# Patient Record
Sex: Male | Born: 1970 | Race: Black or African American | Hispanic: No | Marital: Married | Smoking: Never smoker
Health system: Southern US, Community
[De-identification: ages and names within clinical notes are randomized; demographics above are authoritative.]

## PROBLEM LIST (undated history)

## (undated) ENCOUNTER — Emergency Department: Discharge status: Absent without leave | Payer: No Typology Code available for payment source

## (undated) DIAGNOSIS — K219 Gastro-esophageal reflux disease without esophagitis: Secondary | ICD-10-CM

## (undated) DIAGNOSIS — M795 Residual foreign body in soft tissue: Secondary | ICD-10-CM

---

## 1989-06-20 HISTORY — PX: EYE SURGERY: SHX253

## 2004-08-28 ENCOUNTER — Emergency Department (HOSPITAL_COMMUNITY): Admission: EM | Admit: 2004-08-28 | Discharge: 2004-08-28 | Payer: Self-pay | Admitting: Emergency Medicine

## 2005-01-11 ENCOUNTER — Emergency Department (HOSPITAL_COMMUNITY): Admission: EM | Admit: 2005-01-11 | Discharge: 2005-01-11 | Payer: Self-pay | Admitting: Emergency Medicine

## 2005-01-14 ENCOUNTER — Emergency Department (HOSPITAL_COMMUNITY): Admission: EM | Admit: 2005-01-14 | Discharge: 2005-01-14 | Payer: Self-pay | Admitting: Emergency Medicine

## 2009-09-07 ENCOUNTER — Emergency Department (HOSPITAL_COMMUNITY): Admission: EM | Admit: 2009-09-07 | Discharge: 2009-09-07 | Payer: Self-pay | Admitting: Emergency Medicine

## 2009-09-07 ENCOUNTER — Emergency Department (HOSPITAL_BASED_OUTPATIENT_CLINIC_OR_DEPARTMENT_OTHER): Admission: EM | Admit: 2009-09-07 | Discharge: 2009-09-07 | Payer: Self-pay | Admitting: Emergency Medicine

## 2009-12-28 ENCOUNTER — Emergency Department (HOSPITAL_BASED_OUTPATIENT_CLINIC_OR_DEPARTMENT_OTHER): Admission: EM | Admit: 2009-12-28 | Discharge: 2009-12-28 | Payer: Self-pay | Admitting: Emergency Medicine

## 2011-03-01 ENCOUNTER — Emergency Department (HOSPITAL_COMMUNITY): Payer: No Typology Code available for payment source

## 2011-03-01 ENCOUNTER — Emergency Department (HOSPITAL_COMMUNITY)
Admission: EM | Admit: 2011-03-01 | Discharge: 2011-03-01 | Disposition: A | Discharge status: Home / Self Care | Payer: No Typology Code available for payment source | Attending: Emergency Medicine | Admitting: Emergency Medicine

## 2011-03-01 DIAGNOSIS — M545 Low back pain, unspecified: Secondary | ICD-10-CM | POA: Insufficient documentation

## 2011-03-01 DIAGNOSIS — M542 Cervicalgia: Secondary | ICD-10-CM | POA: Insufficient documentation
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
## 2015-10-26 DIAGNOSIS — Z0000 Encounter for general adult medical examination without abnormal findings: Secondary | ICD-10-CM | POA: Insufficient documentation

## 2016-08-28 ENCOUNTER — Encounter (HOSPITAL_BASED_OUTPATIENT_CLINIC_OR_DEPARTMENT_OTHER): Payer: Self-pay

## 2016-08-28 ENCOUNTER — Emergency Department (HOSPITAL_BASED_OUTPATIENT_CLINIC_OR_DEPARTMENT_OTHER)
Admission: EM | Admit: 2016-08-28 | Discharge: 2016-08-28 | Disposition: A | Discharge status: Home / Self Care | Payer: No Typology Code available for payment source | Attending: Emergency Medicine | Admitting: Emergency Medicine

## 2016-08-28 DIAGNOSIS — Y999 Unspecified external cause status: Secondary | ICD-10-CM | POA: Insufficient documentation

## 2016-08-28 DIAGNOSIS — M542 Cervicalgia: Secondary | ICD-10-CM | POA: Insufficient documentation

## 2016-08-28 DIAGNOSIS — Y92410 Unspecified street and highway as the place of occurrence of the external cause: Secondary | ICD-10-CM | POA: Insufficient documentation

## 2016-08-28 DIAGNOSIS — Y9389 Activity, other specified: Secondary | ICD-10-CM | POA: Diagnosis not present

## 2016-08-28 DIAGNOSIS — M545 Low back pain, unspecified: Secondary | ICD-10-CM

## 2016-08-28 DIAGNOSIS — M62830 Muscle spasm of back: Secondary | ICD-10-CM | POA: Diagnosis not present

## 2016-08-28 DIAGNOSIS — S199XXA Unspecified injury of neck, initial encounter: Secondary | ICD-10-CM | POA: Diagnosis present

## 2016-08-28 MED ORDER — NAPROXEN 500 MG PO TABS: 500.0000 mg | ORAL_TABLET | Freq: Two times a day (BID) | ORAL | 0 refills | Status: DC | PRN

## 2016-08-28 MED ORDER — CYCLOBENZAPRINE HCL 10 MG PO TABS: 10.0000 mg | ORAL_TABLET | Freq: Three times a day (TID) | ORAL | 0 refills | Status: DC | PRN

## 2016-08-28 MED ORDER — NAPROXEN 250 MG PO TABS
500.0000 mg | ORAL_TABLET | Freq: Once | ORAL | Status: AC
  Administered 2016-08-28: 500 mg via ORAL
  Filled 2016-08-28: qty 2
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
## 2016-08-28 NOTE — Discharge Instructions (Signed)
Take naprosyn as directed for inflammation and pain with tylenol for breakthrough pain and flexeril for muscle relaxation. Do not drive or operate machinery with muscle relaxant use. Use heat to areas of soreness, no more than 20 minutes at a time every hour for each. Expect to be sore for the next few days and follow up with primary care physician for recheck of ongoing symptoms in the next 1-2 weeks. Return to ER for emergent changing or worsening of symptoms.
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
## 2019-06-27 DIAGNOSIS — S069X9A Unspecified intracranial injury with loss of consciousness of unspecified duration, initial encounter: Secondary | ICD-10-CM | POA: Diagnosis not present

## 2019-06-27 DIAGNOSIS — H579 Unspecified disorder of eye and adnexa: Secondary | ICD-10-CM | POA: Diagnosis not present

## 2019-06-27 DIAGNOSIS — Z7689 Persons encountering health services in other specified circumstances: Secondary | ICD-10-CM | POA: Diagnosis not present

## 2019-08-05 DIAGNOSIS — Z7689 Persons encountering health services in other specified circumstances: Secondary | ICD-10-CM | POA: Diagnosis not present

## 2019-10-20 ENCOUNTER — Ambulatory Visit
Admission: EM | Admit: 2019-10-20 | Discharge: 2019-10-20 | Disposition: A | Discharge status: Home / Self Care | Payer: Medicaid Other | Attending: Physician Assistant | Admitting: Physician Assistant

## 2019-10-20 ENCOUNTER — Other Ambulatory Visit: Payer: Self-pay

## 2019-10-20 DIAGNOSIS — M25511 Pain in right shoulder: Secondary | ICD-10-CM

## 2019-10-20 DIAGNOSIS — M546 Pain in thoracic spine: Secondary | ICD-10-CM

## 2019-10-20 MED ORDER — DICLOFENAC SODIUM 75 MG PO TBEC: 75.0000 mg | DELAYED_RELEASE_TABLET | Freq: Two times a day (BID) | ORAL | 0 refills | Status: DC

## 2019-10-20 MED ORDER — TIZANIDINE HCL 2 MG PO TABS: 2.0000 mg | ORAL_TABLET | Freq: Three times a day (TID) | ORAL | 0 refills | Status: DC | PRN
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
## 2019-11-19 DIAGNOSIS — H52222 Regular astigmatism, left eye: Secondary | ICD-10-CM | POA: Diagnosis not present

## 2019-11-27 DIAGNOSIS — H5213 Myopia, bilateral: Secondary | ICD-10-CM | POA: Diagnosis not present

## 2020-01-16 NOTE — Patient Instructions (Signed)
Thank you for choosing Primary Care at [facility] to be your medical home!    Louis Ivery was seen by Melina Schools, DO today.   Thelma Barge primary care provider is Phill Myron, DO.   For the best care possible, you should try to see Phill Myron, DO whenever you come to the clinic.   We look forward to seeing you again soon!  If you have any questions about your visit today, please call us at [phone] or feel free to reach your primary care provider via [facility].

## 2020-01-17 ENCOUNTER — Telehealth (INDEPENDENT_AMBULATORY_CARE_PROVIDER_SITE_OTHER): Payer: Medicaid Other | Admitting: Internal Medicine

## 2020-01-17 ENCOUNTER — Encounter: Payer: Self-pay | Admitting: Internal Medicine

## 2020-01-17 DIAGNOSIS — M25562 Pain in left knee: Secondary | ICD-10-CM | POA: Diagnosis not present

## 2020-01-17 DIAGNOSIS — G8929 Other chronic pain: Secondary | ICD-10-CM | POA: Diagnosis not present

## 2020-01-17 DIAGNOSIS — Z7689 Persons encountering health services in other specified circumstances: Secondary | ICD-10-CM

## 2020-01-17 DIAGNOSIS — M545 Low back pain, unspecified: Secondary | ICD-10-CM

## 2020-01-17 DIAGNOSIS — M25561 Pain in right knee: Secondary | ICD-10-CM | POA: Diagnosis not present
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
## 2020-01-31 ENCOUNTER — Ambulatory Visit (INDEPENDENT_AMBULATORY_CARE_PROVIDER_SITE_OTHER): Payer: Medicaid Other | Admitting: Family Medicine

## 2020-01-31 ENCOUNTER — Encounter: Payer: Self-pay | Admitting: Family Medicine

## 2020-01-31 ENCOUNTER — Ambulatory Visit (INDEPENDENT_AMBULATORY_CARE_PROVIDER_SITE_OTHER): Payer: Medicaid Other

## 2020-01-31 ENCOUNTER — Ambulatory Visit: Payer: Self-pay

## 2020-01-31 DIAGNOSIS — M25562 Pain in left knee: Secondary | ICD-10-CM

## 2020-01-31 DIAGNOSIS — G8929 Other chronic pain: Secondary | ICD-10-CM

## 2020-01-31 DIAGNOSIS — M5441 Lumbago with sciatica, right side: Secondary | ICD-10-CM | POA: Diagnosis not present

## 2020-01-31 DIAGNOSIS — M25561 Pain in right knee: Secondary | ICD-10-CM | POA: Diagnosis not present

## 2020-01-31 DIAGNOSIS — M5442 Lumbago with sciatica, left side: Secondary | ICD-10-CM | POA: Diagnosis not present

## 2020-01-31 MED ORDER — MELOXICAM 15 MG PO TABS: 7.5000 mg | ORAL_TABLET | Freq: Every day | ORAL | 6 refills | Status: DC | PRN

## 2020-01-31 NOTE — Patient Instructions (Signed)
   Glucosamine Sulfate:  1,000 mg twice daily  Turmeric:  500 mg twice daily   
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
## 2020-02-11 ENCOUNTER — Ambulatory Visit: Payer: Medicaid Other | Admitting: Internal Medicine

## 2020-02-29 DIAGNOSIS — H524 Presbyopia: Secondary | ICD-10-CM | POA: Diagnosis not present

## 2020-03-12 ENCOUNTER — Encounter: Payer: Self-pay | Admitting: Family Medicine

## 2020-03-13 MED ORDER — NABUMETONE 500 MG PO TABS: 500.0000 mg | ORAL_TABLET | Freq: Two times a day (BID) | ORAL | 3 refills | Status: DC | PRN

## 2020-03-18 ENCOUNTER — Other Ambulatory Visit: Payer: Self-pay

## 2020-03-18 ENCOUNTER — Telehealth: Payer: Self-pay

## 2020-03-18 MED ORDER — NABUMETONE 500 MG PO TABS: 500.0000 mg | ORAL_TABLET | Freq: Two times a day (BID) | ORAL | 3 refills | Status: DC | PRN

## 2020-03-18 NOTE — Telephone Encounter (Signed)
error 

## 2020-03-18 NOTE — Telephone Encounter (Signed)
Advised the patient's wife the medication was resent to [facility].

## 2020-03-18 NOTE — Telephone Encounter (Signed)
Patient wife called she is requesting prescription to be resent to a different pharmacy for Nabumetone.   Pharmacy: [facility] on [address] [place]  [address]  Call back:[phone]

## 2020-03-19 MED ORDER — NABUMETONE 500 MG PO TABS: 500.0000 mg | ORAL_TABLET | Freq: Two times a day (BID) | ORAL | 3 refills | Status: DC | PRN

## 2020-03-19 NOTE — Addendum Note (Signed)
Addended by: Hortencia Pilar on: 03/19/2020 08:07 AM   Modules accepted: Orders

## 2020-03-31 ENCOUNTER — Other Ambulatory Visit: Payer: Self-pay | Admitting: Family Medicine

## 2020-03-31 MED ORDER — CELECOXIB 200 MG PO CAPS: 200.0000 mg | ORAL_CAPSULE | Freq: Two times a day (BID) | ORAL | 6 refills | Status: DC | PRN

## 2020-04-30 NOTE — Patient Instructions (Signed)

## 2020-05-01 ENCOUNTER — Encounter: Payer: Self-pay | Admitting: Internal Medicine

## 2020-05-01 ENCOUNTER — Ambulatory Visit (INDEPENDENT_AMBULATORY_CARE_PROVIDER_SITE_OTHER): Payer: Medicaid Other | Admitting: Internal Medicine

## 2020-05-01 ENCOUNTER — Other Ambulatory Visit: Payer: Self-pay

## 2020-05-01 VITALS — BP 141/90 | HR 94 | Temp 98.1°F | Resp 17 | Ht 72.0 in | Wt 271.0 lb

## 2020-05-01 DIAGNOSIS — Z130 Encounter for screening for diseases of the blood and blood-forming organs and certain disorders involving the immune mechanism: Secondary | ICD-10-CM

## 2020-05-01 DIAGNOSIS — Z113 Encounter for screening for infections with a predominantly sexual mode of transmission: Secondary | ICD-10-CM

## 2020-05-01 DIAGNOSIS — Z1159 Encounter for screening for other viral diseases: Secondary | ICD-10-CM

## 2020-05-01 DIAGNOSIS — Z125 Encounter for screening for malignant neoplasm of prostate: Secondary | ICD-10-CM | POA: Diagnosis not present

## 2020-05-01 DIAGNOSIS — Z1211 Encounter for screening for malignant neoplasm of colon: Secondary | ICD-10-CM

## 2020-05-01 DIAGNOSIS — Z0000 Encounter for general adult medical examination without abnormal findings: Secondary | ICD-10-CM

## 2020-05-01 DIAGNOSIS — Z13220 Encounter for screening for lipoid disorders: Secondary | ICD-10-CM

## 2020-05-01 DIAGNOSIS — Z13228 Encounter for screening for other metabolic disorders: Secondary | ICD-10-CM

## 2020-05-01 DIAGNOSIS — N529 Male erectile dysfunction, unspecified: Secondary | ICD-10-CM

## 2020-05-01 MED ORDER — SILDENAFIL CITRATE 100 MG PO TABS: 50.0000 mg | ORAL_TABLET | Freq: Every day | ORAL | 11 refills | Status: DC | PRN

## 2020-05-01 NOTE — Progress Notes (Signed)
Subjective:    Tyler Alexander - 49 y.o. male MRN [id number]  Date of birth: 26-Jun-1970  HPI  Tyler Alexander is here for annual exam. Not currently employed. Works out by walking daily and [employer].      Health Maintenance:  Health Maintenance Due  Topic Date Due  . Hepatitis C Screening  Never done  . COVID-19 Vaccine (1) Never done  . HIV Screening  Never done  . COLONOSCOPY  Never done    -  reports that he has never smoked. He has never used smokeless tobacco. - Review of Systems: Per HPI. - Past Medical History: There are no problems to display for this patient.  - Medications: reviewed and updated   Objective:   Physical Exam BP (!) 141/90   Pulse 94   Temp 98.1 F (36.7 C) (Temporal)   Resp 17   Ht 6' (1.829 m)   Wt 271 lb (122.9 kg)   SpO2 96%   BMI 36.75 kg/m  Physical Exam Constitutional:      Appearance: He is not diaphoretic.  HENT:     Head: Normocephalic and atraumatic.  Eyes:     Conjunctiva/sclera: Conjunctivae normal.     Pupils: Pupils are equal, round, and reactive to light.  Neck:     Thyroid: No thyromegaly.  Cardiovascular:     Rate and Rhythm: Normal rate and regular rhythm.     Heart sounds: Normal heart sounds. No murmur heard.   Pulmonary:     Effort: Pulmonary effort is normal. No respiratory distress.     Breath sounds: Normal breath sounds. No wheezing.  Abdominal:     General: Bowel sounds are normal. There is no distension.     Palpations: Abdomen is soft.     Tenderness: There is no abdominal tenderness. There is no guarding or rebound.  Musculoskeletal:        General: No deformity. Normal range of motion.     Cervical back: Normal range of motion and neck supple.  Lymphadenopathy:     Cervical: No cervical adenopathy.  Skin:    General: Skin is warm and dry.     Findings: No rash.  Neurological:     Mental Status: He is alert and oriented to person, place, and time.     Gait: Gait is intact.  Psychiatric:         Mood and Affect: Mood and affect normal.        Judgment: Judgment normal.            Assessment & Plan:   1. Annual physical exam Counseled on 150 minutes of exercise per week, healthy eating (including decreased daily intake of saturated fats, cholesterol, added sugars, sodium), STI prevention, routine healthcare maintenance. - CBC with Differential - Comprehensive metabolic panel - Lipid Panel  2. Screening for deficiency anemia - CBC with Differential  3. Screening for metabolic disorder - Comprehensive metabolic panel  4. Lipid screening - Lipid Panel  5. Need for hepatitis C screening test - HCV Ab w/Rflx to Verification  6. Screening for STDs (sexually transmitted diseases) - HIV antibody (with reflex) - RPR - GC/Chlamydia Probe Amp  7. Colon cancer screening Discussed different modalities of colon cancer screening. Patient opted for Cologuard.   8. Screening PSA (prostate specific antigen) Discussed risk vs. benefit of screening for prostate cancer. Patient opted out.   9. Erectile dysfunction, unspecified erectile dysfunction type - sildenafil (VIAGRA) 100 MG tablet; Take 0.5-1 tablets (50-100 mg  total) by mouth daily as needed for erectile dysfunction.  Dispense: 5 tablet; Refill: [place], D.O. 05/01/2020, 9:07 AM Primary Care at [facility]

## 2020-05-02 LAB — CBC WITH DIFFERENTIAL/PLATELET
Basophils Absolute: 0 10*3/uL (ref 0.0–0.2)
Basos: 1 %
EOS (ABSOLUTE): 0.3 10*3/uL (ref 0.0–0.4)
Eos: 8 %
Hematocrit: 39.1 % (ref 37.5–51.0)
Hemoglobin: 13.3 g/dL (ref 13.0–17.7)
Immature Grans (Abs): 0 10*3/uL (ref 0.0–0.1)
Immature Granulocytes: 0 %
Lymphocytes Absolute: 1.3 10*3/uL (ref 0.7–3.1)
Lymphs: 35 %
MCH: 31.4 pg (ref 26.6–33.0)
MCHC: 34 g/dL (ref 31.5–35.7)
MCV: 92 fL (ref 79–97)
Monocytes Absolute: 0.5 10*3/uL (ref 0.1–0.9)
Monocytes: 13 %
Neutrophils Absolute: 1.6 10*3/uL (ref 1.4–7.0)
Neutrophils: 43 %
Platelets: 161 10*3/uL (ref 150–450)
RBC: 4.23 x10E6/uL (ref 4.14–5.80)
RDW: 12.8 % (ref 11.6–15.4)
WBC: 3.8 10*3/uL (ref 3.4–10.8)

## 2020-05-02 LAB — COMPREHENSIVE METABOLIC PANEL
ALT: 31 IU/L (ref 0–44)
AST: 31 IU/L (ref 0–40)
Albumin/Globulin Ratio: 1.7 (ref 1.2–2.2)
Albumin: 4.5 g/dL (ref 4.0–5.0)
Alkaline Phosphatase: 81 IU/L (ref 44–121)
BUN/Creatinine Ratio: 9 (ref 9–20)
BUN: 11 mg/dL (ref 6–24)
Bilirubin Total: 0.4 mg/dL (ref 0.0–1.2)
CO2: 25 mmol/L (ref 20–29)
Calcium: 9.2 mg/dL (ref 8.7–10.2)
Chloride: 105 mmol/L (ref 96–106)
Creatinine, Ser: 1.2 mg/dL (ref 0.76–1.27)
GFR calc Af Amer: 81 mL/min/{1.73_m2} (ref >59)
GFR calc non Af Amer: 70 mL/min/{1.73_m2} (ref >59)
Globulin, Total: 2.7 g/dL (ref 1.5–4.5)
Glucose: 96 mg/dL (ref 65–99)
Potassium: 4.3 mmol/L (ref 3.5–5.2)
Sodium: 141 mmol/L (ref 134–144)
Total Protein: 7.2 g/dL (ref 6.0–8.5)

## 2020-05-02 LAB — LIPID PANEL
Chol/HDL Ratio: 3.5 ratio (ref 0.0–5.0)
Cholesterol, Total: 210 mg/dL — ABNORMAL HIGH (ref 100–199)
HDL: 60 mg/dL (ref >39)
LDL Chol Calc (NIH): 137 mg/dL — ABNORMAL HIGH (ref 0–99)
Triglycerides: 74 mg/dL (ref 0–149)
VLDL Cholesterol Cal: 13 mg/dL (ref 5–40)

## 2020-05-02 LAB — RPR: RPR Ser Ql: NONREACTIVE

## 2020-05-02 LAB — HCV AB W/RFLX TO VERIFICATION: HCV Ab: <0.1 s/co ratio (ref 0.0–0.9)

## 2020-05-02 LAB — HCV INTERPRETATION

## 2020-05-02 LAB — HIV ANTIBODY (ROUTINE TESTING W REFLEX): HIV Screen 4th Generation wRfx: NONREACTIVE

## 2020-05-05 ENCOUNTER — Telehealth: Payer: Self-pay

## 2020-05-05 LAB — GC/CHLAMYDIA PROBE AMP
Chlamydia trachomatis, NAA: NEGATIVE
Neisseria Gonorrhoeae by PCR: NEGATIVE

## 2020-05-05 NOTE — Telephone Encounter (Signed)
Pt verified. Spoke w/pt aware of negative results.

## 2020-07-21 ENCOUNTER — Encounter: Payer: Self-pay | Admitting: Family Medicine

## 2020-07-21 DIAGNOSIS — M5441 Lumbago with sciatica, right side: Secondary | ICD-10-CM

## 2020-07-21 DIAGNOSIS — G8929 Other chronic pain: Secondary | ICD-10-CM

## 2020-10-29 ENCOUNTER — Ambulatory Visit: Payer: Medicaid Other | Admitting: Internal Medicine

## 2021-01-20 ENCOUNTER — Telehealth: Payer: Self-pay | Admitting: Internal Medicine

## 2021-01-20 NOTE — Telephone Encounter (Signed)
..   Medicaid Managed Care   Unsuccessful Outreach Note  01/20/2021 Name: Tyler Alexander MRN: [id number] DOB: 1971-04-17  Referred by: Nicolette Bang, MD Reason for referral : High Risk Managed Medicaid (I attempted to reach the patient today to offer him a phone visit appt with the [facility] Team. I called twice and kept getting a fast busy signal.)   An unsuccessful telephone outreach was attempted today. The patient was referred to the case management team for assistance with care management and care coordination.   Follow Up Plan: The care management team will reach out to the patient again over the next 7-14 days.   [place]

## 2021-05-04 ENCOUNTER — Telehealth: Payer: Self-pay | Admitting: Family

## 2021-05-04 DIAGNOSIS — R490 Dysphonia: Secondary | ICD-10-CM

## 2021-05-04 DIAGNOSIS — Z87891 Personal history of nicotine dependence: Secondary | ICD-10-CM

## 2021-05-04 DIAGNOSIS — K219 Gastro-esophageal reflux disease without esophagitis: Secondary | ICD-10-CM

## 2021-05-04 MED ORDER — CETIRIZINE HCL 10 MG PO TABS: 10.0000 mg | ORAL_TABLET | Freq: Every day | ORAL | 11 refills | Status: AC

## 2021-05-04 MED ORDER — OMEPRAZOLE 20 MG PO CPDR: 20.0000 mg | DELAYED_RELEASE_CAPSULE | Freq: Every day | ORAL | 3 refills | Status: DC

## 2021-05-04 NOTE — Progress Notes (Signed)
Virtual Visit Consent   Tyler Alexander, you are scheduled for a virtual visit with a [facility] provider today.     Just as with appointments in the office, your consent must be obtained to participate.  Your consent will be active for this visit and any virtual visit you may have with one of our providers in the next 365 days.     If you have a MyChart account, a copy of this consent can be sent to you electronically.  All virtual visits are billed to your insurance company just like a traditional visit in the office.    As this is a virtual visit, video technology does not allow for your provider to perform a traditional examination.  This may limit your provider's ability to fully assess your condition.  If your provider identifies any concerns that need to be evaluated in person or the need to arrange testing (such as labs, EKG, etc.), we will make arrangements to do so.     Although advances in technology are sophisticated, we cannot ensure that it will always work on either your end or our end.  If the connection with a video visit is poor, the visit may have to be switched to a telephone visit.  With either a video or telephone visit, we are not always able to ensure that we have a secure connection.     I need to obtain your verbal consent now.   Are you willing to proceed with your visit today?    Tyler Alexander has provided verbal consent on 05/04/2021 for a virtual visit (video or telephone).   Evelina Dun, FNP   Date: 05/04/2021 6:10 PM   Virtual Visit via Video Note   I, Evelina Dun, connected with  Tyler Alexander  ([id number], [birth year]) on 05/04/21 at  6:00 PM EST by a video-enabled telemedicine application and verified that I am speaking with the correct person using two identifiers.  Location: Patient: Virtual Visit Location Patient: Home Provider: Virtual Visit Location Provider: Home Office   I discussed the limitations of evaluation and management by telemedicine  and the availability of in person appointments. The patient expressed understanding and agreed to proceed.    History of Present Illness: Tyler Alexander is a 50 y.o. who identifies as a male who was assigned male at birth, and is being seen today for hoarse voice that started a month ago. He denies any fevers, congestion, sore throat, or cough. He does have a hx of smoking for 30 years with a 2 pack hx.   He does reports intermittent GERD symptoms twice over the last month that has woke him up with acid in his throat.    HPI: HPI  Problems: There are no problems to display for this patient.   Allergies: No Known Allergies Medications:  Current Outpatient Medications:    cetirizine (ZYRTEC) 10 MG tablet, Take 1 tablet (10 mg total) by mouth daily., Disp: 30 tablet, Rfl: 11   omeprazole (PRILOSEC) 20 MG capsule, Take 1 capsule (20 mg total) by mouth daily., Disp: 30 capsule, Rfl: 3   Multiple Vitamins-Minerals (ALIVE MULTI-VITAMIN PO), Take by mouth daily., Disp: , Rfl:    sildenafil (VIAGRA) 100 MG tablet, Take 0.5-1 tablets (50-100 mg total) by mouth daily as needed for erectile dysfunction., Disp: 5 tablet, Rfl: 11  Observations/Objective: Patient is well-developed, well-nourished in no acute distress.  Resting comfortably  at home.  Head is normocephalic, atraumatic.  No labored breathing.  Speech is clear  and coherent with logical content.  Patient is alert and oriented at baseline.  Hoarse voice   Assessment and Plan: 1. Hoarseness of voice - cetirizine (ZYRTEC) 10 MG tablet; Take 1 tablet (10 mg total) by mouth daily.  Dispense: 30 tablet; Refill: 11  2. Hx of smoking  3. Gastroesophageal reflux disease, unspecified whether esophagitis present - omeprazole (PRILOSEC) 20 MG capsule; Take 1 capsule (20 mg total) by mouth daily.  Dispense: 30 capsule; Refill: 3 Start zyrtec daily  Omeprazole 20 mg daily -Diet discussed- Avoid fried, spicy, citrus foods, caffeine and alcohol -Do  not eat 2-3 hours before bedtime -Encouraged small frequent meals -Avoid NSAID's He does need to call his PCP this week and schedule follow up. Discussed given 30 year smoking history this needs to be evaluated more to rule out cancer/tumor of head and neck.  Follow Up Instructions: I discussed the assessment and treatment plan with the patient. The patient was provided an opportunity to ask questions and all were answered. The patient agreed with the plan and demonstrated an understanding of the instructions.  A copy of instructions were sent to the patient via MyChart unless otherwise noted below.     The patient was advised to call back or seek an in-person evaluation if the symptoms worsen or if the condition fails to improve as anticipated.  Time:  I spent 22 minutes with the patient via telehealth technology discussing the above problems/concerns.    Evelina Dun, FNP

## 2021-05-18 ENCOUNTER — Ambulatory Visit: Admission: EM | Admit: 2021-05-18 | Discharge: 2021-05-18 | Discharge status: Against Medical Advice | Payer: Self-pay

## 2021-05-19 ENCOUNTER — Other Ambulatory Visit: Payer: Self-pay

## 2021-05-19 ENCOUNTER — Ambulatory Visit
Admission: EM | Admit: 2021-05-19 | Discharge: 2021-05-19 | Disposition: A | Discharge status: Home / Self Care | Payer: Self-pay | Attending: Physician Assistant | Admitting: Physician Assistant

## 2021-05-19 VITALS — BP 122/89 | HR 94 | Temp 98.8°F | Ht 74.0 in | Wt 250.0 lb

## 2021-05-19 DIAGNOSIS — J040 Acute laryngitis: Secondary | ICD-10-CM

## 2021-05-19 MED ORDER — PREDNISONE 20 MG PO TABS: 40.0000 mg | ORAL_TABLET | Freq: Every day | ORAL | 0 refills | Status: AC

## 2021-05-19 NOTE — ED Triage Notes (Signed)
Patient c/o that his voice has been hoarse for 2 months.  Patient did do a video visit and was prescribed medicine for acid reflux which did not help at all.

## 2021-05-19 NOTE — ED Provider Notes (Signed)
[facility]    CSN: [id number] Arrival date & time: 05/19/21  1700      History   Chief Complaint Chief Complaint  Patient presents with   Voice Hoarness    HPI Tyler Alexander is a 50 y.o. male.   Patient here today for evaluation of hoarseness he has had for the last 6-8 weeks. He denies any throat pain. He has tried PPI that was prescribed without improvement. He denies any shortness of breath. He has not had difficulty swallowing.   The history is provided by the patient.   History reviewed. No pertinent past medical history.  There are no problems to display for this patient.   Past Surgical History:  Procedure Laterality Date   EYE SURGERY Right 1991       Home Medications    Prior to Admission medications   Medication Sig Start Date End Date Taking? Authorizing Provider  cetirizine (ZYRTEC) 10 MG tablet Take 1 tablet (10 mg total) by mouth daily. 05/04/21  Yes Hawks, Christy A, FNP  Multiple Vitamins-Minerals (ALIVE MULTI-VITAMIN PO) Take by mouth daily.   Yes [provider]  omeprazole (PRILOSEC) 20 MG capsule Take 1 capsule (20 mg total) by mouth daily. 05/04/21  Yes Hawks, Christy A, FNP  predniSONE (DELTASONE) 20 MG tablet Take 2 tablets (40 mg total) by mouth daily with breakfast for 5 days. 05/19/21 05/24/21 Yes Francene Finders, PA-C  sildenafil (VIAGRA) 100 MG tablet Take 0.5-1 tablets (50-100 mg total) by mouth daily as needed for erectile dysfunction. 05/01/20  Yes Nicolette Bang, MD    Family History History reviewed. No pertinent family history.  Social History Social History   Tobacco Use   Smoking status: Never   Smokeless tobacco: Never  Substance Use Topics   Alcohol use: Yes   Drug use: No     Allergies   Patient has no known allergies.   Review of Systems Review of Systems  Constitutional:  Negative for chills and fever.  HENT:  Positive for voice change. Negative for congestion, ear pain,  sore throat and trouble swallowing.   Eyes:  Negative for discharge and redness.  Respiratory:  Negative for cough and shortness of breath.   Gastrointestinal:  Negative for abdominal pain, nausea and vomiting.    Physical Exam Triage Vital Signs ED Triage Vitals  Enc Vitals Group     BP      Pulse      Resp      Temp      Temp src      SpO2      Weight      Height      Head Circumference      Peak Flow      Pain Score      Pain Loc      Pain Edu?      Excl. in [place]?    No data found.  Updated Vital Signs BP 122/89 (BP Location: Right Arm)   Pulse 94   Temp 98.8 F (37.1 C) (Oral)   Ht 6\' 2"  (1.88 m)   Wt 250 lb (113.4 kg)   SpO2 97%   BMI 32.10 kg/m      Physical Exam Vitals and nursing note reviewed.  Constitutional:      General: He is not in acute distress.    Appearance: Normal appearance. He is not ill-appearing.  HENT:     Head: Normocephalic and atraumatic.     Nose:  Nose normal. No congestion.     Mouth/Throat:     Mouth: Mucous membranes are moist.     Pharynx: Oropharynx is clear. No oropharyngeal exudate or posterior oropharyngeal erythema.  Eyes:     Conjunctiva/sclera: Conjunctivae normal.  Cardiovascular:     Rate and Rhythm: Normal rate.  Pulmonary:     Effort: Pulmonary effort is normal. No respiratory distress.  Skin:    General: Skin is warm and dry.  Neurological:     Mental Status: He is alert.  Psychiatric:        Mood and Affect: Mood normal.        Thought Content: Thought content normal.     UC Treatments / Results  Labs (all labs ordered are listed, but only abnormal results are displayed) Labs Reviewed - No data to display  EKG   Radiology No results found.  Procedures Procedures (including critical care time)  Medications Ordered in UC Medications - No data to display  Initial Impression / Assessment and Plan / UC Course  I have reviewed the triage vital signs and the nursing notes.  Pertinent labs &  imaging results that were available during my care of the patient were reviewed by me and considered in my medical decision making (see chart for details).   Will trial steroid burst to hopefully decrease inflammation and improve symptoms but strict instruction given to follow up with PCP if symptoms are not improving with treatment. Patient expresses understanding.   Final Clinical Impressions(s) / UC Diagnoses   Final diagnoses:  Laryngitis     Discharge Instructions      Follow up with PCP if symptoms persist after steroid treatment.      ED Prescriptions     Medication Sig Dispense Auth. Provider   predniSONE (DELTASONE) 20 MG tablet Take 2 tablets (40 mg total) by mouth daily with breakfast for 5 days. 10 tablet Francene Finders, PA-C      PDMP not reviewed this encounter.   Francene Finders, PA-C 05/19/21 [phone]

## 2021-05-19 NOTE — Discharge Instructions (Signed)
Follow up with PCP if symptoms persist after steroid treatment.

## 2021-05-27 ENCOUNTER — Ambulatory Visit (INDEPENDENT_AMBULATORY_CARE_PROVIDER_SITE_OTHER): Payer: Self-pay | Admitting: Family Medicine

## 2021-05-27 ENCOUNTER — Other Ambulatory Visit: Payer: Self-pay

## 2021-05-27 ENCOUNTER — Encounter: Payer: Self-pay | Admitting: Family Medicine

## 2021-05-27 VITALS — BP 132/87 | HR 80 | Temp 98.1°F | Resp 16 | Ht 74.0 in | Wt 266.4 lb

## 2021-05-27 DIAGNOSIS — K2100 Gastro-esophageal reflux disease with esophagitis, without bleeding: Secondary | ICD-10-CM

## 2021-05-27 DIAGNOSIS — F32A Depression, unspecified: Secondary | ICD-10-CM

## 2021-05-27 DIAGNOSIS — R490 Dysphonia: Secondary | ICD-10-CM

## 2021-05-27 DIAGNOSIS — N529 Male erectile dysfunction, unspecified: Secondary | ICD-10-CM

## 2021-05-27 DIAGNOSIS — F419 Anxiety disorder, unspecified: Secondary | ICD-10-CM

## 2021-05-27 MED ORDER — PAROXETINE HCL 20 MG PO TABS: 20.0000 mg | ORAL_TABLET | Freq: Every day | ORAL | 0 refills | Status: AC

## 2021-05-27 MED ORDER — OMEPRAZOLE 40 MG PO CPDR: 40.0000 mg | DELAYED_RELEASE_CAPSULE | Freq: Every day | ORAL | 3 refills | Status: AC

## 2021-05-27 MED ORDER — SILDENAFIL CITRATE 100 MG PO TABS: 50.0000 mg | ORAL_TABLET | Freq: Every day | ORAL | 11 refills | Status: AC | PRN
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
## 2021-05-31 ENCOUNTER — Encounter: Payer: Self-pay | Admitting: Family Medicine

## 2021-05-31 NOTE — Progress Notes (Signed)
Established Patient Office Visit  Subjective:  Patient ID: Tyler Alexander, male    DOB: 1970/12/28  Age: 50 y.o. MRN: [id number]  CC: No chief complaint on file.   HPI Eligio Angert presents for complaint of increasing reflux and now with hoarseness. Patient is a smoker.  No past medical history on file.  Past Surgical History:  Procedure Laterality Date   EYE SURGERY Right 1991    No family history on file.  Social History   Socioeconomic History   Marital status: Married    Spouse name: Not on file   Number of children: Not on file   Years of education: Not on file   Highest education level: Not on file  Occupational History   Not on file  Tobacco Use   Smoking status: Never   Smokeless tobacco: Never  Substance and Sexual Activity   Alcohol use: Yes   Drug use: No   Sexual activity: Not on file  Other Topics Concern   Not on file  Social History Narrative   Not on file   Social Determinants of Health   Financial Resource Strain: Not on file  Food Insecurity: Not on file  Transportation Needs: Not on file  Physical Activity: Not on file  Stress: Not on file  Social Connections: Not on file  Intimate Partner Violence: Not on file    ROS Review of Systems  HENT:  Positive for sore throat and voice change. Negative for trouble swallowing.   All other systems reviewed and are negative.  Objective:   Today's Vitals: BP 132/87   Pulse 80   Temp 98.1 F (36.7 C) (Oral)   Resp 16   Ht 6\' 2"  (1.88 m)   Wt 266 lb 6.4 oz (120.8 kg)   SpO2 95%   BMI 34.20 kg/m   Physical Exam Vitals and nursing note reviewed.  Constitutional:      General: He is not in acute distress. HENT:     Mouth/Throat:     Mouth: Mucous membranes are moist.     Pharynx: Oropharynx is clear. No oropharyngeal exudate or posterior oropharyngeal erythema.  Cardiovascular:     Rate and Rhythm: Normal rate and regular rhythm.  Pulmonary:     Effort: Pulmonary effort is normal.      Breath sounds: Normal breath sounds.  Abdominal:     Palpations: Abdomen is soft.     Tenderness: There is no abdominal tenderness.  Neurological:     General: No focal deficit present.     Mental Status: He is alert and oriented to person, place, and time.    Assessment & Plan:   1. Hoarseness Referral to ENT for further eval/mgt - Ambulatory referral to ENT  2. Anxiety and depression Patient prescribed paxil 20 mg daily. Monitor  - PARoxetine (PAXIL) 20 MG tablet; Take 1 tablet (20 mg total) by mouth daily.  Dispense: 30 tablet; Refill: 0  3. Gastroesophageal reflux disease with esophagitis without hemorrhage Discussed dietary and activity options. prilosec - omeprazole (PRILOSEC) 40 MG capsule; Take 1 capsule (40 mg total) by mouth daily.  Dispense: 30 capsule; Refill: 3  4. Erectile dysfunction, unspecified erectile dysfunction type Viagra 100 mg prescribed - sildenafil (VIAGRA) 100 MG tablet; Take 0.5-1 tablets (50-100 mg total) by mouth daily as needed for erectile dysfunction.  Dispense: 5 tablet; Refill: 11    Outpatient Encounter Medications as of 05/27/2021  Medication Sig   cetirizine (ZYRTEC) 10 MG tablet Take 1 tablet (10  mg total) by mouth daily.   Multiple Vitamins-Minerals (ALIVE MULTI-VITAMIN PO) Take by mouth daily.   omeprazole (PRILOSEC) 40 MG capsule Take 1 capsule (40 mg total) by mouth daily.   PARoxetine (PAXIL) 20 MG tablet Take 1 tablet (20 mg total) by mouth daily.   [DISCONTINUED] omeprazole (PRILOSEC) 20 MG capsule Take 1 capsule (20 mg total) by mouth daily.   [DISCONTINUED] sildenafil (VIAGRA) 100 MG tablet Take 0.5-1 tablets (50-100 mg total) by mouth daily as needed for erectile dysfunction.   sildenafil (VIAGRA) 100 MG tablet Take 0.5-1 tablets (50-100 mg total) by mouth daily as needed for erectile dysfunction.   No facility-administered encounter medications on file as of 05/27/2021.    Follow-up: Return in about 4 weeks (around  06/24/2021) for follow up.   Becky Sax, MD

## 2021-06-07 ENCOUNTER — Encounter: Payer: Self-pay | Admitting: Family Medicine

## 2021-06-24 ENCOUNTER — Other Ambulatory Visit: Payer: Self-pay

## 2021-06-24 ENCOUNTER — Encounter: Payer: Self-pay | Admitting: Family Medicine

## 2021-06-25 NOTE — Progress Notes (Signed)
This visit was canceled. Patient did not show

## 2021-07-07 ENCOUNTER — Encounter: Payer: Self-pay | Admitting: *Deleted

## 2021-07-09 ENCOUNTER — Telehealth: Payer: Self-pay | Admitting: Family Medicine

## 2021-07-09 NOTE — Telephone Encounter (Signed)
Pt came into clinic to show [employer] card information per Nurse's my chart msg to obtain referral for Current Hoarseness problem.  Insurance information is in provider bin since this was during lunchtime.

## 2021-07-09 NOTE — Telephone Encounter (Signed)
Forwarding encounter from earlier for referral information. Pt has [employer] now. Pt asking if referral can be expedited w/ Insurance information. Thank you   Pt came into clinic to show [employer] card information per Nurse's my chart msg to obtain referral for Current Hoarseness problem.  Insurance information is in provider bin since this was during lunchtime.

## 2021-07-12 ENCOUNTER — Ambulatory Visit: Payer: Self-pay | Admitting: Family Medicine

## 2021-10-13 DIAGNOSIS — D380 Neoplasm of uncertain behavior of larynx: Secondary | ICD-10-CM | POA: Diagnosis not present

## 2021-10-13 DIAGNOSIS — K219 Gastro-esophageal reflux disease without esophagitis: Secondary | ICD-10-CM | POA: Diagnosis not present

## 2021-10-13 DIAGNOSIS — R490 Dysphonia: Secondary | ICD-10-CM | POA: Diagnosis not present

## 2021-10-25 ENCOUNTER — Other Ambulatory Visit: Payer: Self-pay | Admitting: Otolaryngology

## 2021-10-28 ENCOUNTER — Encounter (HOSPITAL_BASED_OUTPATIENT_CLINIC_OR_DEPARTMENT_OTHER): Payer: Self-pay | Admitting: Otolaryngology

## 2021-10-28 ENCOUNTER — Other Ambulatory Visit: Payer: Self-pay

## 2021-10-28 NOTE — Progress Notes (Signed)
Pt chart reviewed with Dr. Roanna Banning. Confirmed with Tyler Alexander that this vocal cord mass excision surgery can be done at [facility]. Okay to proceed with surgery as planned pending acute health status changes per Dr. Roanna Banning, anesthesiologist. ?

## 2021-11-08 ENCOUNTER — Ambulatory Visit (HOSPITAL_BASED_OUTPATIENT_CLINIC_OR_DEPARTMENT_OTHER)
Admission: RE | Admit: 2021-11-08 | Discharge: 2021-11-08 | Disposition: A | Discharge status: Home / Self Care | Payer: Medicaid Other | Attending: Otolaryngology | Admitting: Otolaryngology

## 2021-11-08 ENCOUNTER — Ambulatory Visit (HOSPITAL_BASED_OUTPATIENT_CLINIC_OR_DEPARTMENT_OTHER): Payer: Medicaid Other | Admitting: Anesthesiology

## 2021-11-08 ENCOUNTER — Other Ambulatory Visit: Payer: Self-pay

## 2021-11-08 ENCOUNTER — Encounter (HOSPITAL_BASED_OUTPATIENT_CLINIC_OR_DEPARTMENT_OTHER)
Admission: RE | Disposition: A | Discharge status: Home / Self Care | Payer: Self-pay | Source: Home / Self Care | Attending: Otolaryngology

## 2021-11-08 ENCOUNTER — Encounter (HOSPITAL_BASED_OUTPATIENT_CLINIC_OR_DEPARTMENT_OTHER): Payer: Self-pay | Admitting: Otolaryngology

## 2021-11-08 DIAGNOSIS — E669 Obesity, unspecified: Secondary | ICD-10-CM | POA: Diagnosis not present

## 2021-11-08 DIAGNOSIS — R490 Dysphonia: Secondary | ICD-10-CM | POA: Insufficient documentation

## 2021-11-08 DIAGNOSIS — F1290 Cannabis use, unspecified, uncomplicated: Secondary | ICD-10-CM | POA: Insufficient documentation

## 2021-11-08 DIAGNOSIS — Z6836 Body mass index (BMI) 36.0-36.9, adult: Secondary | ICD-10-CM | POA: Diagnosis not present

## 2021-11-08 DIAGNOSIS — D141 Benign neoplasm of larynx: Secondary | ICD-10-CM | POA: Diagnosis not present

## 2021-11-08 DIAGNOSIS — K219 Gastro-esophageal reflux disease without esophagitis: Secondary | ICD-10-CM | POA: Insufficient documentation

## 2021-11-08 DIAGNOSIS — Z87891 Personal history of nicotine dependence: Secondary | ICD-10-CM | POA: Diagnosis not present

## 2021-11-08 DIAGNOSIS — J383 Other diseases of vocal cords: Secondary | ICD-10-CM | POA: Diagnosis not present

## 2021-11-08 DIAGNOSIS — D380 Neoplasm of uncertain behavior of larynx: Secondary | ICD-10-CM | POA: Diagnosis not present

## 2021-11-08 DIAGNOSIS — Z01818 Encounter for other preprocedural examination: Secondary | ICD-10-CM

## 2021-11-08 HISTORY — DX: Gastro-esophageal reflux disease without esophagitis: K21.9

## 2021-11-08 HISTORY — DX: Residual foreign body in soft tissue: M79.5

## 2021-11-08 HISTORY — PX: DIRECT LARYNGOSCOPY: SHX5326

## 2021-11-08 SURGERY — LARYNGOSCOPY, DIRECT
Anesthesia: General | Site: Mouth

## 2021-11-08 MED ORDER — ACETAMINOPHEN 500 MG PO TABS
1000.0000 mg | ORAL_TABLET | Freq: Once | ORAL | Status: AC
  Administered 2021-11-08: 1000 mg via ORAL

## 2021-11-08 MED ORDER — FENTANYL CITRATE (PF) 100 MCG/2ML IJ SOLN
INTRAMUSCULAR | Status: DC | PRN
  Administered 2021-11-08: 100 ug via INTRAVENOUS

## 2021-11-08 MED ORDER — PROPOFOL 10 MG/ML IV BOLUS
INTRAVENOUS | Status: AC
  Filled 2021-11-08: qty 20

## 2021-11-08 MED ORDER — LACTATED RINGERS IV SOLN: INTRAVENOUS | Status: DC

## 2021-11-08 MED ORDER — LABETALOL HCL 5 MG/ML IV SOLN
INTRAVENOUS | Status: DC | PRN
  Administered 2021-11-08 (×2): 5 mg via INTRAVENOUS

## 2021-11-08 MED ORDER — FENTANYL CITRATE (PF) 100 MCG/2ML IJ SOLN: 25.0000 ug | INTRAMUSCULAR | Status: DC | PRN

## 2021-11-08 MED ORDER — MIDAZOLAM HCL 5 MG/5ML IJ SOLN
INTRAMUSCULAR | Status: DC | PRN
Start: 2021-11-08 — End: 2021-11-08
  Administered 2021-11-08: 2 mg via INTRAVENOUS

## 2021-11-08 MED ORDER — FENTANYL CITRATE (PF) 100 MCG/2ML IJ SOLN
INTRAMUSCULAR | Status: AC
  Filled 2021-11-08: qty 2

## 2021-11-08 MED ORDER — DEXAMETHASONE SODIUM PHOSPHATE 4 MG/ML IJ SOLN
INTRAMUSCULAR | Status: DC | PRN
  Administered 2021-11-08: 10 mg via INTRAVENOUS

## 2021-11-08 MED ORDER — LIDOCAINE 2% (20 MG/ML) 5 ML SYRINGE
INTRAMUSCULAR | Status: AC
  Filled 2021-11-08: qty 5

## 2021-11-08 MED ORDER — LIDOCAINE HCL (CARDIAC) PF 100 MG/5ML IV SOSY
PREFILLED_SYRINGE | INTRAVENOUS | Status: DC | PRN
Start: 2021-11-08 — End: 2021-11-08
  Administered 2021-11-08: 100 mg via INTRAVENOUS

## 2021-11-08 MED ORDER — EPINEPHRINE PF 1 MG/ML IJ SOLN
INTRAMUSCULAR | Status: DC | PRN
  Administered 2021-11-08: 1 mg

## 2021-11-08 MED ORDER — ACETAMINOPHEN 500 MG PO TABS
ORAL_TABLET | ORAL | Status: AC
  Filled 2021-11-08: qty 2

## 2021-11-08 MED ORDER — MIDAZOLAM HCL 2 MG/2ML IJ SOLN
INTRAMUSCULAR | Status: AC
  Filled 2021-11-08: qty 2

## 2021-11-08 MED ORDER — PROPOFOL 10 MG/ML IV BOLUS
INTRAVENOUS | Status: DC | PRN
  Administered 2021-11-08: 200 mg via INTRAVENOUS

## 2021-11-08 MED ORDER — EPINEPHRINE PF 1 MG/ML IJ SOLN
INTRAMUSCULAR | Status: AC
  Filled 2021-11-08: qty 1

## 2021-11-08 MED ORDER — MIDAZOLAM HCL 2 MG/2ML IJ SOLN: 0.5000 mg | Freq: Once | INTRAMUSCULAR | Status: DC | PRN

## 2021-11-08 MED ORDER — DEXAMETHASONE SODIUM PHOSPHATE 10 MG/ML IJ SOLN
INTRAMUSCULAR | Status: AC
  Filled 2021-11-08: qty 1

## 2021-11-08 MED ORDER — ONDANSETRON HCL 4 MG/2ML IJ SOLN
INTRAMUSCULAR | Status: AC
  Filled 2021-11-08: qty 2

## 2021-11-08 MED ORDER — SUGAMMADEX SODIUM 500 MG/5ML IV SOLN
INTRAVENOUS | Status: DC | PRN
  Administered 2021-11-08: 400 mg via INTRAVENOUS

## 2021-11-08 MED ORDER — OXYCODONE HCL 5 MG/5ML PO SOLN: 5.0000 mg | Freq: Once | ORAL | Status: DC | PRN

## 2021-11-08 MED ORDER — SUGAMMADEX SODIUM 500 MG/5ML IV SOLN
INTRAVENOUS | Status: AC
  Filled 2021-11-08: qty 5

## 2021-11-08 MED ORDER — OXYCODONE HCL 5 MG PO TABS: 5.0000 mg | ORAL_TABLET | Freq: Once | ORAL | Status: DC | PRN

## 2021-11-08 MED ORDER — ROCURONIUM BROMIDE 100 MG/10ML IV SOLN
INTRAVENOUS | Status: DC | PRN
  Administered 2021-11-08: 80 mg via INTRAVENOUS

## 2021-11-08 MED ORDER — MEPERIDINE HCL 25 MG/ML IJ SOLN: 6.2500 mg | INTRAMUSCULAR | Status: DC | PRN

## 2021-11-08 MED ORDER — ROCURONIUM BROMIDE 10 MG/ML (PF) SYRINGE
PREFILLED_SYRINGE | INTRAVENOUS | Status: AC
  Filled 2021-11-08: qty 10

## 2021-11-08 MED ORDER — ONDANSETRON HCL 4 MG/2ML IJ SOLN
INTRAMUSCULAR | Status: DC | PRN
  Administered 2021-11-08: 4 mg via INTRAVENOUS

## 2021-11-08 SURGICAL SUPPLY — 21 items
CANISTER SUCT 1200ML W/VALVE (MISCELLANEOUS) ×2 IMPLANT
DEFOGGER MIRROR 1QT (MISCELLANEOUS) ×2 IMPLANT
GAUZE SPONGE 4X4 12PLY STRL LF (GAUZE/BANDAGES/DRESSINGS) ×2 IMPLANT
GLOVE BIO SURGEON STRL SZ7.5 (GLOVE) ×2 IMPLANT
GOWN STRL REUS W/ TWL LRG LVL3 (GOWN DISPOSABLE) ×1 IMPLANT
GOWN STRL REUS W/TWL LRG LVL3 (GOWN DISPOSABLE) ×2
GUARD TEETH (MISCELLANEOUS) ×2 IMPLANT
MARKER SKIN DUAL TIP RULER LAB (MISCELLANEOUS) ×2 IMPLANT
NDL SAFETY ECLIPSE 18X1.5 (NEEDLE) ×1 IMPLANT
NDL SPNL 22GX7 QUINCKE BK (NEEDLE) IMPLANT
NEEDLE HYPO 18GX1.5 SHARP (NEEDLE) ×2
NEEDLE SPNL 22GX7 QUINCKE BK (NEEDLE) IMPLANT
NS IRRIG 1000ML POUR BTL (IV SOLUTION) ×2 IMPLANT
PACK BASIN DAY SURGERY FS (CUSTOM PROCEDURE TRAY) ×2 IMPLANT
PATTIES SURGICAL .5 X3 (DISPOSABLE) ×2 IMPLANT
SHEET MEDIUM DRAPE 40X70 STRL (DRAPES) ×2 IMPLANT
SLEEVE SCD COMPRESS KNEE MED (STOCKING) ×2 IMPLANT
SYR CONTROL 10ML LL (SYRINGE) ×2 IMPLANT
SYR TB 1ML LL NO SAFETY (SYRINGE) ×2 IMPLANT
TOWEL GREEN STERILE FF (TOWEL DISPOSABLE) ×2 IMPLANT
TUBE CONNECTING 20X1/4 (TUBING) ×2 IMPLANT

## 2021-11-08 NOTE — Discharge Instructions (Addendum)
The patient may resume all his previous activities and diet.  He may use over-the-counter pain medications as needed.  He has no postop restrictions.   No Tylenol before 3:10pm if needed.  Post Anesthesia Home Care Instructions  Activity: Get plenty of rest for the remainder of the day. A responsible individual must stay with you for 24 hours following the procedure.  For the next 24 hours, DO NOT: -Drive a car -Paediatric nurse -Drink alcoholic beverages -Take any medication unless instructed by your physician -Make any legal decisions or sign important papers.  Meals: Start with liquid foods such as gelatin or soup. Progress to regular foods as tolerated. Avoid greasy, spicy, heavy foods. If nausea and/or vomiting occur, drink only clear liquids until the nausea and/or vomiting subsides. Call your physician if vomiting continues.  Special Instructions/Symptoms: Your throat may feel dry or sore from the anesthesia or the breathing tube placed in your throat during surgery. If this causes discomfort, gargle with warm salt water. The discomfort should disappear within 24 hours.  If you had a scopolamine patch placed behind your ear for the management of post- operative nausea and/or vomiting:  1. The medication in the patch is effective for 72 hours, after which it should be removed.  Wrap patch in a tissue and discard in the trash. Wash hands thoroughly with soap and water. 2. You may remove the patch earlier than 72 hours if you experience unpleasant side effects which may include dry mouth, dizziness or visual disturbances. 3. Avoid touching the patch. Wash your hands with soap and water after contact with the patch.
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
## 2021-11-08 NOTE — Op Note (Signed)
DATE OF PROCEDURE:  11/08/2021                              OPERATIVE REPORT  SURGEON:  Leta Baptist, MD  PREOPERATIVE DIAGNOSES: 1.  Right vocal cord mass.  POSTOPERATIVE DIAGNOSES: 1.  Right vocal cord mass  PROCEDURE PERFORMED: MicroDirect laryngoscopy and excision of the right vocal cord mass  ANESTHESIA:  General endotracheal tube anesthesia.  COMPLICATIONS:  None.  ESTIMATED BLOOD LOSS:  Minimal.  INDICATION FOR PROCEDURE:  Tyler Alexander is a 51 y.o. male with a history of chronic hoarseness for 6 months.  At his last visit, he a small soft tissue mass was noted on the midportion of the right vocal cord.  The patient had a history of chronic tobacco use.  He previously smoked 1 pack/day for 20+ years.  He quit the use of tobacco 3 years ago. Based on the above findings, the decision was made for the patient to undergo the above-stated procedure. The risks, benefits, alternatives, and details of the procedure were discussed with the patient.  Questions were invited and answered.  Informed consent was obtained.  DESCRIPTION:  The patient was taken to the operating room and placed supine on the operating table.  General endotracheal tube anesthesia was administered by the anesthesiologist.  The patient was positioned and prepped and draped in a standard fashion for direct laryngoscopy.  A Dedo laryngoscope was used for examination.  The laryngoscope was inserted via the oral cavity into the pharynx.  Examination of the epiglottis, vallecula, aryepiglottic folds, piriform sinuses, and false vocal folds were all normal.  A small papilloma like soft tissue mass was noted on the midportion of the right vocal cord.  The Dedo laryngoscope was suspended with a Lewy suspender.  Photodocumentation of the larynx was obtained.  An operating microscope was brought into the field.  Under the operating microscope, the right vocal cord mass was excised using a combination of laryngeal forceps and laryngeal  scissors.  The specimens were sent to the pathology department for permanent histologic identification.  Hemostasis was achieved with pledgets soaked with epinephrine.  The laryngoscope was withdrawn.  The care of the patient was turned over to the anesthesiologist.  The patient was awakened from anesthesia without difficulty.  The patient was extubated and transferred to the recovery room in good condition.  OPERATIVE FINDINGS: The patient has a small right vocal cord soft tissue mass.  SPECIMEN: Right vocal cord mass.  FOLLOWUP CARE:  The patient will be discharged home once awake and alert.  The patient will return for reevaluation in my office in 1 week.  Tyler Alexander 11/08/2021 11:08 AM

## 2021-11-08 NOTE — Anesthesia Procedure Notes (Signed)
Procedure Name: Intubation Date/Time: 11/08/2021 10:35 AM Performed by: Verita Lamb, CRNA Pre-anesthesia Checklist: Patient identified, Emergency Drugs available, Suction available and Patient being monitored Patient Re-evaluated:Patient Re-evaluated prior to induction Oxygen Delivery Method: Circle system utilized Preoxygenation: Pre-oxygenation with 100% oxygen Induction Type: IV induction Ventilation: Mask ventilation without difficulty Laryngoscope Size: Glidescope, Mac and 4 Grade View: Grade I Tube type: Oral Tube size: 6.0 mm Number of attempts: 1 Airway Equipment and Method: Stylet and Oral airway Placement Confirmation: ETT inserted through vocal cords under direct vision, positive ETCO2, breath sounds checked- equal and bilateral and CO2 detector Secured at: 23 cm Tube secured with: Tape Dental Injury: Teeth and Oropharynx as per pre-operative assessment  Difficulty Due To: Difficulty was anticipated, Difficult Airway- due to large tongue and Difficult Airway- due to anterior larynx Comments: Pt easy mask ventilation but very anterior glottic opening.  Large tongue, could not angle the ett towards the glottic opening

## 2021-11-08 NOTE — Transfer of Care (Signed)
Immediate Anesthesia Transfer of Care Note  Patient: Tyler Alexander  Procedure(s) Performed: Microsuspesion RIGHT DIRECT LARYNOSCOPY WITH EXCISION OF VOCAL CORD MASS (Mouth)  Patient Location: PACU  Anesthesia Type:General  Level of Consciousness: awake, alert  and oriented  Airway & Oxygen Therapy: Patient Spontanous Breathing and Patient connected to face mask oxygen  Post-op Assessment: Report given to RN and Post -op Vital signs reviewed and stable  Post vital signs: Reviewed and stable  Last Vitals:  Vitals Value Taken Time  BP 165/92 11/08/21 1115  Temp    Pulse 78 11/08/21 1115  Resp 21 11/08/21 1115  SpO2 94 % 11/08/21 1115  Vitals shown include unvalidated device data.  Last Pain:  Vitals:   11/08/21 0903  TempSrc: Oral  PainSc: 0-No pain      Patients Stated Pain Goal: 3 (36/43/83 7793)  Complications: No notable events documented.

## 2021-11-08 NOTE — Anesthesia Postprocedure Evaluation (Signed)
Anesthesia Post Note  Patient: Tyler Alexander  Procedure(s) Performed: Microsuspension RIGHT DIRECT LARYNOSCOPY WITH EXCISION OF VOCAL CORD MASS (Mouth)     Patient location during evaluation: PACU Anesthesia Type: General Level of consciousness: awake and alert Pain management: pain level controlled Vital Signs Assessment: post-procedure vital signs reviewed and stable Respiratory status: spontaneous breathing, nonlabored ventilation, respiratory function stable and patient connected to nasal cannula oxygen Cardiovascular status: blood pressure returned to baseline and stable Postop Assessment: no apparent nausea or vomiting Anesthetic complications: no   No notable events documented.  Last Vitals:  Vitals:   11/08/21 1139 11/08/21 1156  BP:  (!) 127/98  Pulse: 81 77  Resp: 16 16  Temp:  36.6 C  SpO2: 92% 95%    Last Pain:  Vitals:   11/08/21 1156  TempSrc:   PainSc: 0-No pain                 Tiajuana Amass

## 2021-11-09 ENCOUNTER — Encounter (HOSPITAL_BASED_OUTPATIENT_CLINIC_OR_DEPARTMENT_OTHER): Payer: Self-pay | Admitting: Otolaryngology

## 2021-11-09 NOTE — Addendum Note (Signed)
Addendum  created 11/09/21 1240 by Kendalynn Wideman, Ernesta Amble, CRNA   Charge Capture section accepted

## 2021-11-11 LAB — SURGICAL PATHOLOGY

## 2021-11-16 DIAGNOSIS — K219 Gastro-esophageal reflux disease without esophagitis: Secondary | ICD-10-CM | POA: Diagnosis not present

## 2021-11-16 DIAGNOSIS — R490 Dysphonia: Secondary | ICD-10-CM | POA: Diagnosis not present

## 2021-11-16 DIAGNOSIS — D380 Neoplasm of uncertain behavior of larynx: Secondary | ICD-10-CM | POA: Diagnosis not present

## 2022-01-13 ENCOUNTER — Ambulatory Visit (INDEPENDENT_AMBULATORY_CARE_PROVIDER_SITE_OTHER): Payer: Medicaid Other | Admitting: Family Medicine

## 2022-01-13 VITALS — BP 121/77 | HR 79 | Temp 97.8°F | Resp 18 | Wt 294.0 lb

## 2022-01-13 DIAGNOSIS — Z13220 Encounter for screening for lipoid disorders: Secondary | ICD-10-CM

## 2022-01-13 DIAGNOSIS — Z125 Encounter for screening for malignant neoplasm of prostate: Secondary | ICD-10-CM | POA: Diagnosis not present

## 2022-01-13 DIAGNOSIS — Z1211 Encounter for screening for malignant neoplasm of colon: Secondary | ICD-10-CM

## 2022-01-13 DIAGNOSIS — Z130 Encounter for screening for diseases of the blood and blood-forming organs and certain disorders involving the immune mechanism: Secondary | ICD-10-CM | POA: Diagnosis not present

## 2022-01-13 DIAGNOSIS — Z0000 Encounter for general adult medical examination without abnormal findings: Secondary | ICD-10-CM

## 2022-01-13 DIAGNOSIS — S069XAA Unspecified intracranial injury with loss of consciousness status unknown, initial encounter: Secondary | ICD-10-CM | POA: Insufficient documentation

## 2022-01-13 DIAGNOSIS — Z1329 Encounter for screening for other suspected endocrine disorder: Secondary | ICD-10-CM | POA: Diagnosis not present

## 2022-01-13 DIAGNOSIS — Z13228 Encounter for screening for other metabolic disorders: Secondary | ICD-10-CM | POA: Diagnosis not present

## 2022-01-13 DIAGNOSIS — W3400XA Accidental discharge from unspecified firearms or gun, initial encounter: Secondary | ICD-10-CM | POA: Insufficient documentation

## 2022-01-13 DIAGNOSIS — F4310 Post-traumatic stress disorder, unspecified: Secondary | ICD-10-CM | POA: Insufficient documentation

## 2022-01-14 LAB — CBC WITH DIFFERENTIAL/PLATELET
Basophils Absolute: 0 10*3/uL (ref 0.0–0.2)
Basos: 1 %
EOS (ABSOLUTE): 0.2 10*3/uL (ref 0.0–0.4)
Eos: 5 %
Hematocrit: 36.1 % — ABNORMAL LOW (ref 37.5–51.0)
Hemoglobin: 12.2 g/dL — ABNORMAL LOW (ref 13.0–17.7)
Immature Grans (Abs): 0 10*3/uL (ref 0.0–0.1)
Immature Granulocytes: 0 %
Lymphocytes Absolute: 1.6 10*3/uL (ref 0.7–3.1)
Lymphs: 43 %
MCH: 30.7 pg (ref 26.6–33.0)
MCHC: 33.8 g/dL (ref 31.5–35.7)
MCV: 91 fL (ref 79–97)
Monocytes Absolute: 0.4 10*3/uL (ref 0.1–0.9)
Monocytes: 11 %
Neutrophils Absolute: 1.5 10*3/uL (ref 1.4–7.0)
Neutrophils: 40 %
Platelets: 182 10*3/uL (ref 150–450)
RBC: 3.97 x10E6/uL — ABNORMAL LOW (ref 4.14–5.80)
RDW: 12.9 % (ref 11.6–15.4)
WBC: 3.8 10*3/uL (ref 3.4–10.8)

## 2022-01-14 LAB — CMP14+EGFR
ALT: 27 IU/L (ref 0–44)
AST: 26 IU/L (ref 0–40)
Albumin/Globulin Ratio: 1.5 (ref 1.2–2.2)
Albumin: 4 g/dL (ref 3.8–4.9)
Alkaline Phosphatase: 89 IU/L (ref 44–121)
BUN/Creatinine Ratio: 6 — ABNORMAL LOW (ref 9–20)
BUN: 8 mg/dL (ref 6–24)
Bilirubin Total: <0.2 mg/dL (ref 0.0–1.2)
CO2: 23 mmol/L (ref 20–29)
Calcium: 9.2 mg/dL (ref 8.7–10.2)
Chloride: 106 mmol/L (ref 96–106)
Creatinine, Ser: 1.25 mg/dL (ref 0.76–1.27)
Globulin, Total: 2.7 g/dL (ref 1.5–4.5)
Glucose: 98 mg/dL (ref 70–99)
Potassium: 3.9 mmol/L (ref 3.5–5.2)
Sodium: 142 mmol/L (ref 134–144)
Total Protein: 6.7 g/dL (ref 6.0–8.5)
eGFR: 70 mL/min/{1.73_m2} (ref >59)

## 2022-01-14 LAB — LIPID PANEL
Chol/HDL Ratio: 3.5 ratio (ref 0.0–5.0)
Cholesterol, Total: 184 mg/dL (ref 100–199)
HDL: 53 mg/dL (ref >39)
LDL Chol Calc (NIH): 106 mg/dL — ABNORMAL HIGH (ref 0–99)
Triglycerides: 142 mg/dL (ref 0–149)
VLDL Cholesterol Cal: 25 mg/dL (ref 5–40)

## 2022-01-14 LAB — HEMOGLOBIN A1C
Est. average glucose Bld gHb Est-mCnc: 120 mg/dL
Hgb A1c MFr Bld: 5.8 % — ABNORMAL HIGH (ref 4.8–5.6)

## 2022-01-14 LAB — TSH: TSH: 0.528 u[IU]/mL (ref 0.450–4.500)

## 2022-01-14 LAB — PSA: Prostate Specific Ag, Serum: 1.4 ng/mL (ref 0.0–4.0)

## 2022-01-15 ENCOUNTER — Encounter: Payer: Self-pay | Admitting: Family Medicine

## 2022-01-15 ENCOUNTER — Other Ambulatory Visit: Payer: Self-pay | Admitting: Family Medicine

## 2022-01-15 MED ORDER — IRON (FERROUS SULFATE) 325 (65 FE) MG PO TABS: 325.0000 mg | ORAL_TABLET | Freq: Every day | ORAL | 1 refills | Status: AC
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
## 2023-10-26 ENCOUNTER — Encounter (HOSPITAL_COMMUNITY): Payer: Self-pay

## 2023-11-02 ENCOUNTER — Ambulatory Visit (INDEPENDENT_AMBULATORY_CARE_PROVIDER_SITE_OTHER): Admitting: Family Medicine

## 2023-11-02 VITALS — BP 147/86 | Wt 258.4 lb

## 2023-11-02 DIAGNOSIS — R351 Nocturia: Secondary | ICD-10-CM

## 2023-11-02 DIAGNOSIS — M79671 Pain in right foot: Secondary | ICD-10-CM | POA: Diagnosis not present

## 2023-11-02 DIAGNOSIS — L989 Disorder of the skin and subcutaneous tissue, unspecified: Secondary | ICD-10-CM

## 2023-11-02 DIAGNOSIS — Z131 Encounter for screening for diabetes mellitus: Secondary | ICD-10-CM

## 2023-11-02 LAB — POCT GLYCOSYLATED HEMOGLOBIN (HGB A1C): HbA1c, POC (controlled diabetic range): 5.8 % (ref 0.0–7.0)

## 2023-11-02 MED ORDER — TAMSULOSIN HCL 0.4 MG PO CAPS: 0.4000 mg | ORAL_CAPSULE | Freq: Every day | ORAL | 1 refills | Status: AC
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
## 2023-11-06 ENCOUNTER — Encounter: Payer: Self-pay | Admitting: Family Medicine

## 2023-11-28 ENCOUNTER — Encounter: Payer: Self-pay | Admitting: Podiatry

## 2023-11-28 ENCOUNTER — Ambulatory Visit (INDEPENDENT_AMBULATORY_CARE_PROVIDER_SITE_OTHER)

## 2023-11-28 ENCOUNTER — Ambulatory Visit (INDEPENDENT_AMBULATORY_CARE_PROVIDER_SITE_OTHER): Admitting: Podiatry

## 2023-11-28 DIAGNOSIS — M79671 Pain in right foot: Secondary | ICD-10-CM

## 2023-11-28 DIAGNOSIS — M722 Plantar fascial fibromatosis: Secondary | ICD-10-CM

## 2023-11-28 DIAGNOSIS — B079 Viral wart, unspecified: Secondary | ICD-10-CM

## 2023-11-28 NOTE — Progress Notes (Signed)
 Complains of the painful lump on the plantar aspect of the right foot.  Sometimes gets aching and soreness along the arch of the foot also.  Planes of some pain in the 4th and 5th toes with wearing shoes and activity.  Says the lesion has been on the plantar foot for several months now and seems to be getting larger.   Physical exam:  General appearance: Pleasant, and in no acute distress. AOx3.  Vascular: Pedal pulses: DP 2/4 bilaterally, PT 2/4 bilaterally.  No edema lower legs bilaterally. Capillary fill time immediate.  Neurological: Light touch intact feet bilaterally.  Normal Achilles reflex bilaterally.  No clonus or spasticity noted.  Negative Tinel's sign tarsal tunnel and porta pedis right  Dermatologic:   Tender verrucous and plantar heel right with tenderness with lateral pressure on the lesion.  Skin lines go around the lesion with pinpoint hemorrhaging.  Skin normal temperature bilaterally.  Skin normal color, tone, and texture bilaterally.   Musculoskeletal: Tenderness over the PIPJ's of the fourth fifth toes right.  Hammertoe deformities 2 through 5.  Some tenderness along the middle one third of the plantar fascial band of the right foot no fibromas noted  Radiographs: 3 views foot right: Hammertoe deformities 2 through 5 bilaterally especially the fifth toe.  Thickening of the plantar fascia noted.  No signs of any fractures or gross changes around the calcaneus.  No foreign bodies noted.  Diagnosis: 1.  Pain right foot. 2.  Plantar fasciitis right foot. 3.  Plantar verruca of right foot.  Plan: -New patient office visit level 3 for evaluation management. - Discussed with him the wart on the plantar heel right and possible different diagnoses.  Discussed etiology and treatment of warts.  Also discussed the hammertoes which causes discomfort 4th and 5th toes at times.  Recommend well cushioned socks in the toes and proper shoe gear. -Applied Salinocaine compound to  lesion(s) as noted in physical exam after debriding lesions to pinpoint bleeding.  Salinocaine applied to lesion(s) and covered with an occlusive dressing with Coban wrap.  Written and oral instructions given to patient.  -Return in 2 weeks for follow-up verruca and reevaluation.

## 2023-11-30 ENCOUNTER — Telehealth: Payer: Self-pay | Admitting: Podiatry

## 2023-11-30 ENCOUNTER — Ambulatory Visit: Admitting: Family Medicine

## 2023-11-30 VITALS — BP 118/79 | HR 89 | Wt 258.4 lb

## 2023-11-30 DIAGNOSIS — Z130 Encounter for screening for diseases of the blood and blood-forming organs and certain disorders involving the immune mechanism: Secondary | ICD-10-CM | POA: Diagnosis not present

## 2023-11-30 DIAGNOSIS — Z13220 Encounter for screening for lipoid disorders: Secondary | ICD-10-CM | POA: Diagnosis not present

## 2023-11-30 DIAGNOSIS — Z0000 Encounter for general adult medical examination without abnormal findings: Secondary | ICD-10-CM

## 2023-11-30 NOTE — Telephone Encounter (Signed)
 The patient was seen on Tuesday and instructed to keep the site covered for 2 days. After removing the wrap, the patient reports that the site is swollen and tender, especially when walking

## 2023-12-01 ENCOUNTER — Encounter: Payer: Self-pay | Admitting: Podiatry

## 2023-12-01 LAB — CMP14+EGFR
ALT: 32 IU/L (ref 0–44)
AST: 43 IU/L — ABNORMAL HIGH (ref 0–40)
Albumin: 4.2 g/dL (ref 3.8–4.9)
Alkaline Phosphatase: 92 IU/L (ref 44–121)
BUN/Creatinine Ratio: 9 (ref 9–20)
BUN: 11 mg/dL (ref 6–24)
Bilirubin Total: 0.2 mg/dL (ref 0.0–1.2)
CO2: 20 mmol/L (ref 20–29)
Calcium: 9.3 mg/dL (ref 8.7–10.2)
Chloride: 105 mmol/L (ref 96–106)
Creatinine, Ser: 1.21 mg/dL (ref 0.76–1.27)
Globulin, Total: 2.6 g/dL (ref 1.5–4.5)
Glucose: 89 mg/dL (ref 70–99)
Potassium: 4.5 mmol/L (ref 3.5–5.2)
Sodium: 140 mmol/L (ref 134–144)
Total Protein: 6.8 g/dL (ref 6.0–8.5)
eGFR: 72 mL/min/{1.73_m2} (ref >59)

## 2023-12-01 LAB — CBC WITH DIFFERENTIAL/PLATELET
Basophils Absolute: 0 10*3/uL (ref 0.0–0.2)
Basos: 1 %
EOS (ABSOLUTE): 0.4 10*3/uL (ref 0.0–0.4)
Eos: 8 %
Hematocrit: 42.6 % (ref 37.5–51.0)
Hemoglobin: 13.7 g/dL (ref 13.0–17.7)
Immature Grans (Abs): 0 10*3/uL (ref 0.0–0.1)
Immature Granulocytes: 0 %
Lymphocytes Absolute: 1.9 10*3/uL (ref 0.7–3.1)
Lymphs: 43 %
MCH: 31.4 pg (ref 26.6–33.0)
MCHC: 32.2 g/dL (ref 31.5–35.7)
MCV: 98 fL — ABNORMAL HIGH (ref 79–97)
Monocytes Absolute: 0.5 10*3/uL (ref 0.1–0.9)
Monocytes: 11 %
Neutrophils Absolute: 1.6 10*3/uL (ref 1.4–7.0)
Neutrophils: 37 %
Platelets: 177 10*3/uL (ref 150–450)
RBC: 4.37 x10E6/uL (ref 4.14–5.80)
RDW: 12.5 % (ref 11.6–15.4)
WBC: 4.4 10*3/uL (ref 3.4–10.8)

## 2023-12-01 LAB — LIPID PANEL
Chol/HDL Ratio: 3.4 ratio (ref 0.0–5.0)
Cholesterol, Total: 191 mg/dL (ref 100–199)
HDL: 57 mg/dL (ref >39)
LDL Chol Calc (NIH): 118 mg/dL — ABNORMAL HIGH (ref 0–99)
Triglycerides: 90 mg/dL (ref 0–149)
VLDL Cholesterol Cal: 16 mg/dL (ref 5–40)

## 2023-12-01 MED ORDER — DOXYCYCLINE HYCLATE 100 MG PO TABS: 100.0000 mg | ORAL_TABLET | Freq: Two times a day (BID) | ORAL | 0 refills | Status: AC

## 2023-12-01 NOTE — Telephone Encounter (Signed)
 Called patient and relayed the message. He expressed his thanks and said he will update us  on Monday regarding how he is doing.

## 2023-12-04 ENCOUNTER — Ambulatory Visit: Payer: Self-pay | Admitting: Family Medicine

## 2023-12-04 ENCOUNTER — Ambulatory Visit: Payer: Self-pay

## 2023-12-04 NOTE — Telephone Encounter (Signed)
 This RN contacted patient back to answer questions about lab results. Patient asked if he was diabetic. This RN read off A1C from one month ago and his recent glucose levels. Patient has no further questions at this time.    Copied from CRM [id number]. Topic: Clinical - Lab/Test Results >> Dec 04, 2023  1:59 PM Yolanda T wrote: Reason for CRM: patient request to have a nurse call him back to discuss his lab results as he does not understand what the results mean

## 2023-12-04 NOTE — Telephone Encounter (Signed)
 Noted

## 2023-12-12 ENCOUNTER — Ambulatory Visit: Admitting: Podiatry

## 2023-12-25 ENCOUNTER — Ambulatory Visit: Admitting: Podiatry

## 2024-01-04 NOTE — Progress Notes (Signed)
 Patient 's labs were drawn but patient was not seen by provider and will be rescheduled for CPE

## 2024-01-24 ENCOUNTER — Encounter

## 2024-01-24 ENCOUNTER — Telehealth: Payer: Self-pay

## 2024-01-24 NOTE — Telephone Encounter (Signed)
 Tyler Alexander - can you please help get this patient scheduled for a physical? Thank you!  Copied from CRM #[id number]. Topic: Appointments - Appointment Cancel/Reschedule >> Jan 24, 2024 12:02 PM Tobias L wrote: Patient/patient representative is calling to cancel or reschedule an appointment. Refer to attachments for appointment information.   Patient needing to reschedule appointment for today at 3pm with Sula Leavy Rode, PA-C. Attempted to assist with rescheduling, unable to reschedule due to decision tree, decision tree prompting physical for June of 2026. Patient states he had visit w/ provider June of this year but only the labs were draw, patient was informed this physical would be rescheduled. Patient requesting for physical to be rescheduled with Sula.  Please reach out to patient to reschedule, [phone]

## 2024-01-25 NOTE — Telephone Encounter (Signed)
 Pt rescheduled

## 2024-02-12 ENCOUNTER — Ambulatory Visit

## 2024-03-14 ENCOUNTER — Ambulatory Visit

## 2024-04-09 ENCOUNTER — Ambulatory Visit

## 2024-05-21 ENCOUNTER — Ambulatory Visit

## 2024-05-21 VITALS — BP 138/92 | HR 80 | Temp 99.3°F | Resp 17 | Ht 74.0 in | Wt 254.2 lb

## 2024-05-21 DIAGNOSIS — Z712 Person consulting for explanation of examination or test findings: Secondary | ICD-10-CM | POA: Diagnosis not present

## 2024-05-21 DIAGNOSIS — B079 Viral wart, unspecified: Secondary | ICD-10-CM

## 2024-05-21 DIAGNOSIS — Z131 Encounter for screening for diabetes mellitus: Secondary | ICD-10-CM | POA: Diagnosis not present

## 2024-05-21 DIAGNOSIS — Z0000 Encounter for general adult medical examination without abnormal findings: Secondary | ICD-10-CM

## 2024-05-21 LAB — POCT GLYCOSYLATED HEMOGLOBIN (HGB A1C): Hemoglobin A1C: 5.7 % — AB (ref 4.0–5.6)
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
## 2024-07-01 ENCOUNTER — Ambulatory Visit

## 2024-07-18 ENCOUNTER — Ambulatory Visit

## 2024-08-02 ENCOUNTER — Ambulatory Visit

## 2025-01-07 ENCOUNTER — Ambulatory Visit: Admitting: Physician Assistant
